# Patient Record
Sex: Male | Born: 1990 | Race: White | Hispanic: No | Marital: Single | Smoking: Former smoker
Health system: Southern US, Community
[De-identification: ages and names within clinical notes are randomized; demographics above are authoritative.]

## PROBLEM LIST (undated history)

## (undated) DIAGNOSIS — M25569 Pain in unspecified knee: Secondary | ICD-10-CM

## (undated) HISTORY — PX: FOOT CAPSULE RELEASE W/ PERCUTANEOUS HEEL CORD LENGTHENING, TIBIAL TENDON TRANSFER: SHX1658

## (undated) HISTORY — PX: ORIF FINGER / THUMB FRACTURE: SUR932

---
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
## 2016-03-31 ENCOUNTER — Emergency Department: Payer: BLUE CROSS/BLUE SHIELD

## 2016-03-31 ENCOUNTER — Emergency Department
Admission: EM | Admit: 2016-03-31 | Discharge: 2016-03-31 | Disposition: A | Discharge status: Home / Self Care | Payer: BLUE CROSS/BLUE SHIELD | Attending: Emergency Medicine | Admitting: Emergency Medicine

## 2016-03-31 ENCOUNTER — Encounter: Payer: Self-pay | Admitting: Emergency Medicine

## 2016-03-31 DIAGNOSIS — S51811A Laceration without foreign body of right forearm, initial encounter: Secondary | ICD-10-CM | POA: Insufficient documentation

## 2016-03-31 DIAGNOSIS — Y999 Unspecified external cause status: Secondary | ICD-10-CM | POA: Insufficient documentation

## 2016-03-31 DIAGNOSIS — Z87891 Personal history of nicotine dependence: Secondary | ICD-10-CM | POA: Insufficient documentation

## 2016-03-31 DIAGNOSIS — Z23 Encounter for immunization: Secondary | ICD-10-CM | POA: Insufficient documentation

## 2016-03-31 DIAGNOSIS — T07XXXA Unspecified multiple injuries, initial encounter: Secondary | ICD-10-CM

## 2016-03-31 DIAGNOSIS — Y92410 Unspecified street and highway as the place of occurrence of the external cause: Secondary | ICD-10-CM | POA: Diagnosis not present

## 2016-03-31 DIAGNOSIS — S50812A Abrasion of left forearm, initial encounter: Secondary | ICD-10-CM | POA: Diagnosis not present

## 2016-03-31 DIAGNOSIS — Y9389 Activity, other specified: Secondary | ICD-10-CM | POA: Insufficient documentation

## 2016-03-31 DIAGNOSIS — S59911A Unspecified injury of right forearm, initial encounter: Secondary | ICD-10-CM | POA: Diagnosis present

## 2016-03-31 MED ORDER — OXYCODONE-ACETAMINOPHEN 5-325 MG PO TABS
ORAL_TABLET | ORAL | Status: AC
  Filled 2016-03-31: qty 1

## 2016-03-31 MED ORDER — OXYCODONE-ACETAMINOPHEN 5-325 MG PO TABS
1.0000 | ORAL_TABLET | ORAL | Status: DC | PRN
Start: 2016-03-31 — End: 2016-03-31
  Administered 2016-03-31: 1 via ORAL

## 2016-03-31 MED ORDER — TETANUS-DIPHTH-ACELL PERTUSSIS 5-2.5-18.5 LF-MCG/0.5 IM SUSP
0.5000 mL | Freq: Once | INTRAMUSCULAR | Status: AC
  Administered 2016-03-31: 0.5 mL via INTRAMUSCULAR
  Filled 2016-03-31: qty 0.5

## 2016-03-31 MED ORDER — LIDOCAINE HCL (PF) 1 % IJ SOLN
INTRAMUSCULAR | Status: AC
  Administered 2016-03-31: 5 mL
  Filled 2016-03-31: qty 5
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
## 2016-03-31 NOTE — Discharge Instructions (Signed)
Laceration Care, Adult  A laceration is a cut that goes through all layers of the skin. The cut also goes into the tissue that is right under the skin. Some cuts heal on their own. Others need to be closed with stitches (sutures), staples, skin adhesive strips, or wound glue. Taking care of your cut lowers your risk of infection and helps your cut to heal better.  HOW TO TAKE CARE OF YOUR CUT  For stitches or staples:  · Keep the wound clean and dry.  · If you were given a bandage (dressing), you should change it at least one time per day or as told by your doctor. You should also change it if it gets wet or dirty.  · Keep the wound completely dry for the first 24 hours or as told by your doctor. After that time, you may take a shower or a bath. However, make sure that the wound is not soaked in water until after the stitches or staples have been removed.  · Clean the wound one time each day or as told by your doctor:    Wash the wound with soap and water.    Rinse the wound with water until all of the soap comes off.    Pat the wound dry with a clean towel. Do not rub the wound.  · After you clean the wound, put a thin layer of antibiotic ointment on it as told by your doctor. This ointment:    Helps to prevent infection.    Keeps the bandage from sticking to the wound.  · Have your stitches or staples removed as told by your doctor.  If your doctor used skin adhesive strips:   · Keep the wound clean and dry.  · If you were given a bandage, you should change it at least one time per day or as told by your doctor. You should also change it if it gets dirty or wet.  · Do not get the skin adhesive strips wet. You can take a shower or a bath, but be careful to keep the wound dry.  · If the wound gets wet, pat it dry with a clean towel. Do not rub the wound.  · Skin adhesive strips fall off on their own. You can trim the strips as the wound heals. Do not remove any strips that are still stuck to the wound. They will  fall off after a while.  If your doctor used wound glue:  · Try to keep your wound dry, but you may briefly wet it in the shower or bath. Do not soak the wound in water, such as by swimming.  · After you take a shower or a bath, gently pat the wound dry with a clean towel. Do not rub the wound.  · Do not do any activities that will make you really sweaty until the skin glue has fallen off on its own.  · Do not apply liquid, cream, or ointment medicine to your wound while the skin glue is still on.  · If you were given a bandage, you should change it at least one time per day or as told by your doctor. You should also change it if it gets dirty or wet.  · If a bandage is placed over the wound, do not let the tape for the bandage touch the skin glue.  · Do not pick at the glue. The skin glue usually stays on for 5-10 days. Then, it   or when wound glue stays in place and the wound is healed. Make sure to wear a sunscreen of at least 30 SPF. °· Take over-the-counter and prescription medicines only as told by your doctor. °· If you were given antibiotic medicine or ointment, take or apply it as told by your doctor. Do not stop using the antibiotic even if your wound is getting better. °· Do not scratch or pick at the wound. °· Keep all follow-up visits as told by your doctor. This is important. °· Check your wound every day for signs of infection. Watch for: °· Redness, swelling, or pain. °· Fluid, blood, or pus. °· Raise (elevate) the injured area above the level of your heart while you are sitting or lying down, if possible. °GET HELP IF: °· You got a tetanus shot and you have any of these problems at the injection site: °¨ Swelling. °¨ Very bad pain. °¨ Redness. °¨ Bleeding. °· You have a fever. °· A wound that was  closed breaks open. °· You notice a bad smell coming from your wound or your bandage. °· You notice something coming out of the wound, such as wood or glass. °· Medicine does not help your pain. °· You have more redness, swelling, or pain at the site of your wound. °· You have fluid, blood, or pus coming from your wound. °· You notice a change in the color of your skin near your wound. °· You need to change the bandage often because fluid, blood, or pus is coming from the wound. °· You start to have a new rash. °· You start to have numbness around the wound. °GET HELP RIGHT AWAY IF: °· You have very bad swelling around the wound. °· Your pain suddenly gets worse and is very bad. °· You notice painful lumps near the wound or on skin that is anywhere on your body. °· You have a red streak going away from your wound. °· The wound is on your hand or foot and you cannot move a finger or toe like you usually can. °· The wound is on your hand or foot and you notice that your fingers or toes look pale or bluish. °  °This information is not intended to replace advice given to you by your health care provider. Make sure you discuss any questions you have with your health care provider. °  °Document Released: 05/18/2008 Document Revised: 04/16/2015 Document Reviewed: 11/26/2014 °Elsevier Interactive Patient Education ©2016 Elsevier Inc. ° °Sutured Wound Care °Sutures are stitches that can be used to close wounds. Taking care of your wound properly can help to prevent pain and infection. It can also help your wound to heal more quickly. °HOW TO CARE FOR YOUR SUTURED WOUND °Wound Care °· Keep the wound clean and dry. °· If you were given a bandage (dressing), you should change it at least once per day or as directed by your health care provider. You should also change it if it becomes wet or dirty. °· Keep the wound completely dry for the first 24 hours or as directed by your health care provider. After that time, you may shower  or bathe. However, make sure that the wound is not soaked in water until the sutures have been removed. °· Clean the wound one time each day or as directed by your health care provider. °¨ Wash the wound with soap and water. °¨ Rinse the wound with water to remove all soap. °¨ Pat the wound dry with   a clean towel. Do not rub the wound.  Aftercleaning the wound, apply a thin layer of antibioticointment as directed by your health care provider. This will help to prevent infection and keep the dressing from sticking to the wound.  Have the sutures removed as directed by your health care provider. General Instructions  Take or apply medicines only as directed by your health care provider.  To help prevent scarring, make sure to cover your wound with sunscreen whenever you are outside after the sutures are removed and the wound is healed. Make sure to wear a sunscreen of at least 30 SPF.  If you were prescribed an antibiotic medicine or ointment, finish all of it even if you start to feel better.  Do not scratch or pick at the wound.  Keep all follow-up visits as directed by your health care provider. This is important.  Check your wound every day for signs of infection. Watch for:   Redness, swelling, or pain.  Fluid, blood, or pus.  Raise (elevate) the injured area above the level of your heart while you are sitting or lying down, if possible.  Avoid stretching your wound.  Drink enough fluids to keep your urine clear or pale yellow. SEEK MEDICAL CARE IF:  You received a tetanus shot and you have swelling, severe pain, redness, or bleeding at the injection site.  You have a fever.  A wound that was closed breaks open.  You notice a bad smell coming from the wound.  You notice something coming out of the wound, such as wood or glass.  Your pain is not controlled with medicine.  You have increased redness, swelling, or pain at the site of your wound.  You have fluid, blood,  or pus coming from your wound.  You notice a change in the color of your skin near your wound.  You need to change the dressing frequently due to fluid, blood, or pus draining from the wound.  You develop a new rash.  You develop numbness around the wound. SEEK IMMEDIATE MEDICAL CARE IF:  You develop severe swelling around the injury site.  Your pain suddenly increases and is severe.  You develop painful lumps near the wound or on skin that is anywhere on your body.  You have a red streak going away from your wound.  The wound is on your hand or foot and you cannot properly move a finger or toe.  The wound is on your hand or foot and you notice that your fingers or toes look pale or bluish.   This information is not intended to replace advice given to you by your health care provider. Make sure you discuss any questions you have with your health care provider.   Document Released: 01/07/2005 Document Revised: 04/16/2015 Document Reviewed: 07/12/2013 Elsevier Interactive Patient Education [employer]2016 Elsevier Inc.

## 2016-04-04 ENCOUNTER — Encounter: Payer: Self-pay | Admitting: Gynecology

## 2016-04-04 ENCOUNTER — Ambulatory Visit
Admission: EM | Admit: 2016-04-04 | Discharge: 2016-04-04 | Disposition: A | Discharge status: Home / Self Care | Payer: BLUE CROSS/BLUE SHIELD | Attending: Family Medicine | Admitting: Family Medicine

## 2016-04-04 DIAGNOSIS — T798XXA Other early complications of trauma, initial encounter: Secondary | ICD-10-CM | POA: Diagnosis not present

## 2016-04-04 HISTORY — DX: Pain in unspecified knee: M25.569

## 2016-04-04 MED ORDER — CEFUROXIME AXETIL 500 MG PO TABS: 500.0000 mg | ORAL_TABLET | Freq: Two times a day (BID) | ORAL | Status: DC

## 2016-04-04 MED ORDER — MUPIROCIN 2 % EX OINT: 1.0000 "application " | TOPICAL_OINTMENT | Freq: Three times a day (TID) | CUTANEOUS | Status: DC

## 2016-04-04 NOTE — ED Notes (Signed)
Per patient [facility] for [facility]. Per patient suture in right forearm and abrasion on left elbow. Patient question infection with left elbow abrasion.
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
## 2016-04-04 NOTE — Discharge Instructions (Signed)
Wound Infection °A wound infection happens when a type of germ (bacteria) grows in a wound. Caring for the infection can help the wound heal. Wound infections need treatment. °HOME CARE  °· Only take medicine as told by your doctor. °· Take your antibiotic medicine as told. Finish it even if you start to feel better. °· Clean the wound with mild soap and water as told. Rinse the soap off. Pat the area dry with a clean towel. Do not rub the wound. °· Change any bandages (dressings) as told by your doctor. °· Put cream and a bandage on the wound as told by your doctor. °· If the bandage sticks, wet it with soapy water to remove the bandage. °· Change the bandage if it gets wet, dirty, or starts to smell. °· Take showers. Do not take baths, swim, or do anything that puts your wound under water. °· Avoid exercise that makes you sweat. °· If your wound itches, use a medicine that helps stop itching. Do not pick or scratch at the wound. °· Keep all doctor visits as told. °GET HELP RIGHT AWAY IF:  °· You have more puffiness (swelling), pain, or redness around the wound. °· You have more yellowish-white fluid (pus) coming from the wound. °· You have a bad smell coming from the wound. °· Your wound breaks open more. °· You have a fever. °MAKE SURE YOU:  °· Understand these instructions. °· Will watch your condition. °· Will get help right away if you are not doing well or get worse. °  °This information is not intended to replace advice given to you by your health care provider. Make sure you discuss any questions you have with your health care provider. °  °Document Released: 09/08/2008 Document Revised: 02/22/2012 Document Reviewed: 05/20/2015 °Elsevier Interactive Patient Education ©2016 Elsevier Inc. ° °
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
## 2018-08-25 ENCOUNTER — Other Ambulatory Visit: Payer: Self-pay

## 2018-08-25 ENCOUNTER — Emergency Department
Admission: EM | Admit: 2018-08-25 | Discharge: 2018-08-25 | Disposition: A | Discharge status: Home / Self Care | Payer: BLUE CROSS/BLUE SHIELD | Attending: Student in an Organized Health Care Education/Training Program | Admitting: Student in an Organized Health Care Education/Training Program

## 2018-08-25 DIAGNOSIS — Z79899 Other long term (current) drug therapy: Secondary | ICD-10-CM | POA: Insufficient documentation

## 2018-08-25 DIAGNOSIS — Z87891 Personal history of nicotine dependence: Secondary | ICD-10-CM | POA: Insufficient documentation

## 2018-08-25 DIAGNOSIS — Y929 Unspecified place or not applicable: Secondary | ICD-10-CM | POA: Diagnosis not present

## 2018-08-25 DIAGNOSIS — S61210A Laceration without foreign body of right index finger without damage to nail, initial encounter: Secondary | ICD-10-CM

## 2018-08-25 DIAGNOSIS — S6991XA Unspecified injury of right wrist, hand and finger(s), initial encounter: Secondary | ICD-10-CM | POA: Diagnosis present

## 2018-08-25 DIAGNOSIS — S61211A Laceration without foreign body of left index finger without damage to nail, initial encounter: Secondary | ICD-10-CM | POA: Diagnosis not present

## 2018-08-25 DIAGNOSIS — Y999 Unspecified external cause status: Secondary | ICD-10-CM | POA: Insufficient documentation

## 2018-08-25 DIAGNOSIS — W268XXA Contact with other sharp object(s), not elsewhere classified, initial encounter: Secondary | ICD-10-CM | POA: Diagnosis not present

## 2018-08-25 DIAGNOSIS — Y939 Activity, unspecified: Secondary | ICD-10-CM | POA: Diagnosis not present

## 2018-08-25 MED ORDER — LIDOCAINE HCL (PF) 1 % IJ SOLN
5.0000 mL | Freq: Once | INTRAMUSCULAR | Status: AC
  Administered 2018-08-25: 5 mL via INTRADERMAL
  Filled 2018-08-25: qty 5

## 2018-08-25 MED ORDER — TRAMADOL HCL 50 MG PO TABS: 50.0000 mg | ORAL_TABLET | Freq: Four times a day (QID) | ORAL | 0 refills | Status: DC | PRN

## 2018-08-25 MED ORDER — CEPHALEXIN 500 MG PO CAPS: 500.0000 mg | ORAL_CAPSULE | Freq: Three times a day (TID) | ORAL | 0 refills | Status: DC

## 2018-08-25 NOTE — ED Provider Notes (Signed)
[facility] Emergency Department Provider Note  ____________________________________________   First MD Initiated Contact with Patient 08/25/18 1744     (approximate)  I have reviewed the triage vital signs and the nursing notes.   HISTORY  Chief Complaint Laceration    HPI Standley DakinsKellan R Kepner is a 27 y.o. male presents emergency department complaining of lacerations to both index fingers.  He cut it on metal prior to arrival.  He states his tetanus is up-to-date.  He denies any loss of motion of the fingers.    Past Medical History:  Diagnosis Date  . Knee pain     There are no active problems to display for this patient.   Past Surgical History:  Procedure Laterality Date  . FOOT CAPSULE RELEASE W/ PERCUTANEOUS HEEL CORD LENGTHENING, TIBIAL TENDON TRANSFER    . ORIF FINGER / THUMB FRACTURE      Prior to Admission medications   Medication Sig Start Date End Date Taking? Authorizing Provider  cefUROXime (CEFTIN) 500 MG tablet Take 1 tablet (500 mg total) by mouth 2 (two) times daily. 04/04/16   Hassan RowanWade, Eugene, MD  cephALEXin (KEFLEX) 500 MG capsule Take 1 capsule (500 mg total) by mouth 3 (three) times daily. 08/25/18   Dejanique Ruehl, Roselyn BeringSusan W, PA-C  mupirocin ointment (BACTROBAN) 2 % Apply 1 application topically 3 (three) times daily. 04/04/16   Hassan RowanWade, Eugene, MD  traMADol (ULTRAM) 50 MG tablet Take 1 tablet (50 mg total) by mouth every 6 (six) hours as needed. 08/25/18   Faythe GheeFisher, Mikiya Nebergall W, PA-C    Allergies Patient has no known allergies.  History reviewed. No pertinent family history.  Social History Social History   Tobacco Use  . Smoking status: Former Smoker  Substance Use Topics  . Alcohol use: Yes  . Drug use: Not on file    Review of Systems  Constitutional: No fever/chills Eyes: No visual changes. ENT: No sore throat. Respiratory: Denies cough Genitourinary: Negative for dysuria. Musculoskeletal: Negative for back pain. Skin: Negative  for rash.  Positive laceration to both index fingers    ____________________________________________   PHYSICAL EXAM:  VITAL SIGNS: ED Triage Vitals  Enc Vitals Group     BP 08/25/18 1710 127/82     Pulse Rate 08/25/18 1710 85     Resp 08/25/18 1710 16     Temp 08/25/18 1710 98.3 F (36.8 C)     Temp Source 08/25/18 1710 Oral     SpO2 08/25/18 1710 98 %     Weight 08/25/18 1711 210 lb (95.3 kg)     Height 08/25/18 1711 6\' 1"  (1.854 m)     Head Circumference --      Peak Flow --      Pain Score 08/25/18 1712 8     Pain Loc --      Pain Edu? --      Excl. in GC? --     Constitutional: Alert and oriented. Well appearing and in no acute distress. Eyes: Conjunctivae are normal.  Head: Atraumatic. Nose: No congestion/rhinnorhea. Mouth/Throat: Mucous membranes are moist.   Neck:  supple no lymphadenopathy noted Cardiovascular: Normal rate, regular rhythm.  Respiratory: Normal respiratory effort.  No retractions GU: deferred Musculoskeletal: FROM all extremities, warm and well perfused, 1 cm laceration to the right index finger, 1.5 cm laceration to the left index finger no foreign body is noted.  No tendon involvement. Neurologic:  Normal speech and language.  Skin:  Skin is warm, dry, positive lacerations  to both index fingers. No rash noted. Psychiatric: Mood and affect are normal. Speech and behavior are normal.  ____________________________________________   LABS (all labs ordered are listed, but only abnormal results are displayed)  Labs Reviewed - No data to display ____________________________________________   ____________________________________________  RADIOLOGY    ____________________________________________   PROCEDURES  Procedure(s) performed:   Marland KitchenMarland KitchenLaceration Repair Date/Time: 08/25/2018 7:51 PM Performed by: Faythe Ghee, PA-C Authorized by: Faythe Ghee, PA-C   Consent:    Consent obtained:  Verbal   Consent given by:  Patient    Risks discussed:  Infection, pain, retained foreign body and poor wound healing   Alternatives discussed:  Delayed treatment Anesthesia (see MAR for exact dosages):    Anesthesia method:  Nerve block   Block needle gauge:  25 G   Block anesthetic:  Lidocaine 1% w/o epi   Block injection procedure:  Anatomic landmarks identified, introduced needle, anatomic landmarks palpated, negative aspiration for blood and incremental injection   Block outcome:  Anesthesia achieved Laceration details:    Location:  Finger   Finger location:  L index finger   Length (cm):  1.5   Depth (mm):  2 Repair type:    Repair type:  Simple Pre-procedure details:    Preparation:  Patient was prepped and draped in usual sterile fashion Exploration:    Hemostasis achieved with:  Direct pressure   Wound exploration: wound explored through full range of motion     Wound extent: no foreign bodies/material noted, no tendon damage noted and no underlying fracture noted     Contaminated: no   Treatment:    Area cleansed with:  Betadine and saline   Amount of cleaning:  Standard   Irrigation solution:  Sterile saline   Irrigation method:  Syringe, pressure wash and tap Skin repair:    Repair method:  Sutures   Suture size:  5-0   Suture material:  Nylon   Suture technique:  Simple interrupted   Number of sutures:  6 Approximation:    Approximation:  Close Post-procedure details:    Dressing:  Non-adherent dressing   Patient tolerance of procedure:  Tolerated well, no immediate complications .Marland KitchenLaceration Repair Date/Time: 08/25/2018 7:53 PM Performed by: Faythe Ghee, PA-C Authorized by: Faythe Ghee, PA-C   Consent:    Consent obtained:  Verbal   Consent given by:  Patient   Risks discussed:  Infection, pain, poor cosmetic result, poor wound healing and retained foreign body   Alternatives discussed:  Delayed treatment Anesthesia (see MAR for exact dosages):    Anesthesia method:  Nerve block    Block anesthetic:  Lidocaine 1% w/o epi   Block injection procedure:  Anatomic landmarks identified, introduced needle, incremental injection, negative aspiration for blood and anatomic landmarks palpated   Block outcome:  Anesthesia achieved Laceration details:    Location:  Finger   Finger location:  R index finger   Length (cm):  1   Depth (mm):  2 Repair type:    Repair type:  Simple Pre-procedure details:    Preparation:  Patient was prepped and draped in usual sterile fashion Exploration:    Hemostasis achieved with:  Direct pressure   Wound exploration: wound explored through full range of motion     Wound extent: no foreign bodies/material noted, no tendon damage noted and no underlying fracture noted     Contaminated: no   Treatment:    Area cleansed with:  Betadine and saline  Amount of cleaning:  Standard   Irrigation solution:  Sterile saline   Irrigation method:  Pressure wash, syringe and tap Skin repair:    Repair method:  Sutures   Suture size:  5-0   Suture material:  Nylon   Suture technique:  Simple interrupted   Number of sutures:  5 Approximation:    Approximation:  Close Post-procedure details:    Dressing:  Non-adherent dressing   Patient tolerance of procedure:  Tolerated well, no immediate complications      ____________________________________________   INITIAL IMPRESSION / ASSESSMENT AND PLAN / ED COURSE  Pertinent labs & imaging results that were available during my care of the patient were reviewed by me and considered in my medical decision making (see chart for details).   Patient is a 27 year old male presents emergency department complaining of lacerations to both index fingers.  His tetanus is up-to-date.  On physical exam patient appears well.  The left index finger has a 1.5 cm laceration at the distal aspect.  No nail involvement.  The right index finger has a 1 cm laceration at the distal aspect.  No foreign bodies noted in either  laceration.  Lacerations were closed with 6 and 5 simple sutures.  See procedure note.  The patient was given instructions on how to care for the laceration.  He is clean the area with soap and water daily.  Take the Keflex to prevent infection.  Have sutures removed in 7 to 10 days.  Keep the hands as dry as possible when at work.  He states he understands will comply with our instructions.  Was discharged in stable condition.     As part of my medical decision making, I reviewed the following data within the electronic MEDICAL RECORD NUMBER Nursing notes reviewed and incorporated, Old chart reviewed, Notes from prior ED visits and [place] Controlled Substance Database  ____________________________________________   FINAL CLINICAL IMPRESSION(S) / ED DIAGNOSES  Final diagnoses:  Laceration of right index finger without foreign body without damage to nail, initial encounter  Laceration of left index finger without foreign body without damage to nail, initial encounter      NEW MEDICATIONS STARTED DURING THIS VISIT:  New Prescriptions   CEPHALEXIN (KEFLEX) 500 MG CAPSULE    Take 1 capsule (500 mg total) by mouth 3 (three) times daily.   TRAMADOL (ULTRAM) 50 MG TABLET    Take 1 tablet (50 mg total) by mouth every 6 (six) hours as needed.     Note:  This document was prepared using Dragon voice recognition software and may include unintentional dictation errors.    Faythe Ghee, PA-C 08/25/18 1956    Willy Eddy, MD 08/25/18 2017

## 2018-08-25 NOTE — ED Triage Notes (Signed)
bilat lacs to ends of pointer fingers. Wrapped in gauze in triage. Cut on metal. Tetanus UTD. Alert, oriented, ambulatory.

## 2018-08-25 NOTE — ED Notes (Signed)
See triage note  States he cut both index fingers on metal

## 2018-08-25 NOTE — Discharge Instructions (Signed)
Wash the area with soap and water daily.  Follow-up with your regular doctor in acute care in 7 to 10 days for suture removal.  Keep your hands as clean and dry as possible.  Do not soak your hands down in water or wear plastic gloves all day as this will prevent healing.  Return to the emergency department if any problems

## 2023-10-06 ENCOUNTER — Other Ambulatory Visit: Payer: Self-pay

## 2023-10-06 MED ORDER — LISDEXAMFETAMINE DIMESYLATE 40 MG PO CAPS: 40.0000 mg | ORAL_CAPSULE | Freq: Every morning | ORAL | 0 refills | Status: DC

## 2024-01-18 ENCOUNTER — Other Ambulatory Visit: Payer: Self-pay

## 2024-01-18 MED ORDER — LISDEXAMFETAMINE DIMESYLATE 40 MG PO CAPS
40.0000 mg | ORAL_CAPSULE | Freq: Every morning | ORAL | 0 refills | Status: DC
  Filled 2024-01-18: qty 14, 14d supply, fill #0

## 2024-01-25 ENCOUNTER — Other Ambulatory Visit: Payer: Self-pay

## 2024-02-01 ENCOUNTER — Other Ambulatory Visit: Payer: Self-pay

## 2024-02-01 MED ORDER — LISDEXAMFETAMINE DIMESYLATE 40 MG PO CAPS
40.0000 mg | ORAL_CAPSULE | Freq: Every morning | ORAL | 0 refills | Status: DC
  Filled 2024-02-10: qty 30, 30d supply, fill #0

## 2024-02-10 ENCOUNTER — Other Ambulatory Visit: Payer: Self-pay

## 2024-02-17 ENCOUNTER — Other Ambulatory Visit: Payer: Self-pay

## 2024-02-17 MED ORDER — HYDROXYZINE HCL 10 MG PO TABS
10.0000 mg | ORAL_TABLET | Freq: Two times a day (BID) | ORAL | 0 refills | Status: DC
  Filled 2024-02-17: qty 180, 90d supply, fill #0

## 2024-02-17 MED ORDER — VENLAFAXINE HCL ER 37.5 MG PO CP24
37.5000 mg | ORAL_CAPSULE | Freq: Every day | ORAL | 0 refills | Status: AC
  Filled 2024-02-17: qty 90, 90d supply, fill #0

## 2024-02-17 MED ORDER — LISDEXAMFETAMINE DIMESYLATE 50 MG PO CAPS
50.0000 mg | ORAL_CAPSULE | Freq: Every day | ORAL | 0 refills | Status: DC
  Filled 2024-02-17: qty 30, 30d supply, fill #0
  Filled 2024-03-15: qty 10, 10d supply, fill #0
  Filled 2024-03-15: qty 20, 20d supply, fill #0

## 2024-02-25 ENCOUNTER — Other Ambulatory Visit: Payer: Self-pay

## 2024-03-01 ENCOUNTER — Other Ambulatory Visit: Payer: Self-pay

## 2024-03-07 ENCOUNTER — Other Ambulatory Visit: Payer: Self-pay

## 2024-03-15 ENCOUNTER — Other Ambulatory Visit: Payer: Self-pay

## 2024-03-15 MED ORDER — IPRATROPIUM BROMIDE 0.06 % NA SOLN
2.0000 | Freq: Three times a day (TID) | NASAL | 11 refills | Status: AC
  Filled 2024-03-15: qty 15, 25d supply, fill #0

## 2024-03-15 MED ORDER — BENZONATATE 100 MG PO CAPS
100.0000 mg | ORAL_CAPSULE | Freq: Three times a day (TID) | ORAL | 0 refills | Status: AC
  Filled 2024-03-15: qty 30, 5d supply, fill #0

## 2024-03-24 ENCOUNTER — Other Ambulatory Visit: Payer: Self-pay

## 2024-03-24 MED ORDER — SERTRALINE HCL 50 MG PO TABS
50.0000 mg | ORAL_TABLET | Freq: Every day | ORAL | 0 refills | Status: AC
  Filled 2024-03-24 – 2024-04-18 (×2): qty 30, 30d supply, fill #0

## 2024-03-24 MED ORDER — LISDEXAMFETAMINE DIMESYLATE 50 MG PO CAPS
50.0000 mg | ORAL_CAPSULE | Freq: Every day | ORAL | 0 refills | Status: DC
  Filled 2024-04-12: qty 30, 30d supply, fill #0

## 2024-04-04 ENCOUNTER — Other Ambulatory Visit: Payer: Self-pay

## 2024-04-10 ENCOUNTER — Other Ambulatory Visit: Payer: Self-pay

## 2024-04-10 MED ORDER — LISDEXAMFETAMINE DIMESYLATE 50 MG PO CAPS: 50.0000 mg | ORAL_CAPSULE | Freq: Every day | ORAL | 0 refills | Status: DC

## 2024-04-12 ENCOUNTER — Other Ambulatory Visit: Payer: Self-pay

## 2024-04-18 ENCOUNTER — Other Ambulatory Visit: Payer: Self-pay

## 2024-04-25 ENCOUNTER — Other Ambulatory Visit: Payer: Self-pay

## 2024-04-25 MED ORDER — CITALOPRAM HYDROBROMIDE 20 MG PO TABS
20.0000 mg | ORAL_TABLET | Freq: Every day | ORAL | 0 refills | Status: DC
  Filled 2024-04-25 – 2024-04-26 (×2): qty 30, 30d supply, fill #0

## 2024-04-26 ENCOUNTER — Other Ambulatory Visit: Payer: Self-pay

## 2024-05-04 ENCOUNTER — Other Ambulatory Visit: Payer: Self-pay

## 2024-05-04 MED ORDER — CITALOPRAM HYDROBROMIDE 20 MG PO TABS
20.0000 mg | ORAL_TABLET | Freq: Every day | ORAL | 0 refills | Status: DC
  Filled 2024-06-02: qty 90, 90d supply, fill #0

## 2024-05-04 MED ORDER — LISDEXAMFETAMINE DIMESYLATE 50 MG PO CAPS
50.0000 mg | ORAL_CAPSULE | Freq: Every day | ORAL | 0 refills | Status: DC
  Filled 2024-06-02: qty 90, 90d supply, fill #0

## 2024-05-04 MED ORDER — HYDROXYZINE HCL 10 MG PO TABS
10.0000 mg | ORAL_TABLET | Freq: Two times a day (BID) | ORAL | 0 refills | Status: AC
  Filled 2024-05-04: qty 180, 90d supply, fill #0

## 2024-05-15 ENCOUNTER — Other Ambulatory Visit: Payer: Self-pay

## 2024-05-18 ENCOUNTER — Other Ambulatory Visit: Payer: Self-pay

## 2024-05-18 MED ORDER — AMOXICILLIN-POT CLAVULANATE 875-125 MG PO TABS
1.0000 | ORAL_TABLET | Freq: Two times a day (BID) | ORAL | 0 refills | Status: DC
  Filled 2024-05-18: qty 20, 10d supply, fill #0

## 2024-05-19 ENCOUNTER — Observation Stay
Admission: EM | Admit: 2024-05-19 | Discharge: 2024-05-20 | Disposition: A | Discharge status: Home / Self Care | Attending: Internal Medicine | Admitting: Internal Medicine

## 2024-05-19 ENCOUNTER — Other Ambulatory Visit: Payer: Self-pay

## 2024-05-19 ENCOUNTER — Emergency Department

## 2024-05-19 DIAGNOSIS — A419 Sepsis, unspecified organism: Secondary | ICD-10-CM | POA: Diagnosis not present

## 2024-05-19 DIAGNOSIS — J36 Peritonsillar abscess: Secondary | ICD-10-CM | POA: Diagnosis present

## 2024-05-19 DIAGNOSIS — J029 Acute pharyngitis, unspecified: Principal | ICD-10-CM

## 2024-05-19 DIAGNOSIS — Z79899 Other long term (current) drug therapy: Secondary | ICD-10-CM | POA: Diagnosis not present

## 2024-05-19 DIAGNOSIS — Z87891 Personal history of nicotine dependence: Secondary | ICD-10-CM | POA: Insufficient documentation

## 2024-05-19 DIAGNOSIS — J039 Acute tonsillitis, unspecified: Secondary | ICD-10-CM | POA: Diagnosis present

## 2024-05-19 LAB — CBC WITH DIFFERENTIAL/PLATELET
Abs Immature Granulocytes: 0.09 10*3/uL — ABNORMAL HIGH (ref 0.00–0.07)
Basophils Absolute: 0.1 10*3/uL (ref 0.0–0.1)
Basophils Relative: 0 %
Eosinophils Absolute: 0.4 10*3/uL (ref 0.0–0.5)
Eosinophils Relative: 2 %
HCT: 38.2 % — ABNORMAL LOW (ref 39.0–52.0)
Hemoglobin: 12.7 g/dL — ABNORMAL LOW (ref 13.0–17.0)
Immature Granulocytes: 0 %
Lymphocytes Relative: 12 %
Lymphs Abs: 2.4 10*3/uL (ref 0.7–4.0)
MCH: 28.9 pg (ref 26.0–34.0)
MCHC: 33.2 g/dL (ref 30.0–36.0)
MCV: 87 fL (ref 80.0–100.0)
Monocytes Absolute: 1.9 10*3/uL — ABNORMAL HIGH (ref 0.1–1.0)
Monocytes Relative: 9 %
Neutro Abs: 15.7 10*3/uL — ABNORMAL HIGH (ref 1.7–7.7)
Neutrophils Relative %: 77 %
Platelets: 330 10*3/uL (ref 150–400)
RBC: 4.39 MIL/uL (ref 4.22–5.81)
RDW: 13.1 % (ref 11.5–15.5)
WBC: 20.6 10*3/uL — ABNORMAL HIGH (ref 4.0–10.5)
nRBC: 0 % (ref 0.0–0.2)

## 2024-05-19 LAB — BASIC METABOLIC PANEL WITH GFR
Anion gap: 9 (ref 5–15)
BUN: 14 mg/dL (ref 6–20)
CO2: 26 mmol/L (ref 22–32)
Calcium: 8.6 mg/dL — ABNORMAL LOW (ref 8.9–10.3)
Chloride: 101 mmol/L (ref 98–111)
Creatinine, Ser: 0.98 mg/dL (ref 0.61–1.24)
GFR, Estimated: >60 mL/min (ref >60)
Glucose, Bld: 135 mg/dL — ABNORMAL HIGH (ref 70–99)
Potassium: 3.5 mmol/L (ref 3.5–5.1)
Sodium: 136 mmol/L (ref 135–145)

## 2024-05-19 MED ORDER — ACETAMINOPHEN 325 MG PO TABS: 650.0000 mg | ORAL_TABLET | Freq: Four times a day (QID) | ORAL | Status: DC | PRN

## 2024-05-19 MED ORDER — HYDROCODONE-ACETAMINOPHEN 7.5-325 MG/15ML PO SOLN
10.0000 mL | Freq: Once | ORAL | Status: AC
  Administered 2024-05-19: 10 mL via ORAL
  Filled 2024-05-19: qty 15

## 2024-05-19 MED ORDER — SODIUM CHLORIDE 0.9 % IV SOLN: 1.5000 g | Freq: Four times a day (QID) | INTRAVENOUS | Status: DC

## 2024-05-19 MED ORDER — FLUTICASONE PROPIONATE 50 MCG/ACT NA SUSP
1.0000 | Freq: Every day | NASAL | Status: DC
  Administered 2024-05-19 – 2024-05-20 (×2): 1 via NASAL
  Filled 2024-05-19: qty 16

## 2024-05-19 MED ORDER — HYDROCODONE-ACETAMINOPHEN 7.5-325 MG/15ML PO SOLN: 10.0000 mL | Freq: Four times a day (QID) | ORAL | Status: DC | PRN

## 2024-05-19 MED ORDER — ALUM & MAG HYDROXIDE-SIMETH 200-200-20 MG/5ML PO SUSP
30.0000 mL | ORAL | Status: DC | PRN
  Administered 2024-05-20: 30 mL via ORAL
  Filled 2024-05-19 (×2): qty 30

## 2024-05-19 MED ORDER — ONDANSETRON HCL 4 MG PO TABS: 4.0000 mg | ORAL_TABLET | Freq: Four times a day (QID) | ORAL | Status: DC | PRN

## 2024-05-19 MED ORDER — DEXAMETHASONE SODIUM PHOSPHATE 10 MG/ML IJ SOLN
8.0000 mg | Freq: Three times a day (TID) | INTRAMUSCULAR | Status: DC
  Administered 2024-05-19 – 2024-05-20 (×3): 8 mg via INTRAVENOUS
  Filled 2024-05-19 (×3): qty 1

## 2024-05-19 MED ORDER — KETOROLAC TROMETHAMINE 30 MG/ML IJ SOLN: 30.0000 mg | Freq: Four times a day (QID) | INTRAMUSCULAR | Status: DC | PRN

## 2024-05-19 MED ORDER — KETOROLAC TROMETHAMINE 15 MG/ML IJ SOLN
15.0000 mg | Freq: Four times a day (QID) | INTRAMUSCULAR | Status: DC | PRN
Start: 2024-05-19 — End: 2024-05-20
  Administered 2024-05-19: 15 mg via INTRAVENOUS
  Filled 2024-05-19: qty 1

## 2024-05-19 MED ORDER — DEXAMETHASONE SODIUM PHOSPHATE 10 MG/ML IJ SOLN
10.0000 mg | Freq: Once | INTRAMUSCULAR | Status: AC
  Administered 2024-05-19: 10 mg via INTRAVENOUS
  Filled 2024-05-19: qty 1

## 2024-05-19 MED ORDER — SODIUM CHLORIDE 0.9 % IV SOLN
3.0000 g | Freq: Once | INTRAVENOUS | Status: AC
  Administered 2024-05-19: 3 g via INTRAVENOUS
  Filled 2024-05-19: qty 8

## 2024-05-19 MED ORDER — MORPHINE SULFATE (PF) 2 MG/ML IV SOLN: 2.0000 mg | INTRAVENOUS | Status: DC | PRN

## 2024-05-19 MED ORDER — ACETAMINOPHEN 650 MG RE SUPP: 650.0000 mg | Freq: Four times a day (QID) | RECTAL | Status: DC | PRN

## 2024-05-19 MED ORDER — ONDANSETRON HCL 4 MG/2ML IJ SOLN: 4.0000 mg | Freq: Four times a day (QID) | INTRAMUSCULAR | Status: DC | PRN

## 2024-05-19 MED ORDER — HYDROCODONE-ACETAMINOPHEN 5-325 MG PO TABS
1.0000 | ORAL_TABLET | ORAL | Status: DC | PRN
  Administered 2024-05-19 (×3): 2 via ORAL
  Administered 2024-05-20: 1 via ORAL
  Administered 2024-05-20: 2 via ORAL
  Filled 2024-05-19 (×5): qty 2
  Filled 2024-05-19: qty 1

## 2024-05-19 MED ORDER — SODIUM CHLORIDE 0.9 % IV BOLUS
1000.0000 mL | Freq: Once | INTRAVENOUS | Status: AC
Start: 2024-05-19 — End: 2024-05-19
  Administered 2024-05-19: 1000 mL via INTRAVENOUS

## 2024-05-19 MED ORDER — CITALOPRAM HYDROBROMIDE 20 MG PO TABS
20.0000 mg | ORAL_TABLET | Freq: Every day | ORAL | Status: DC
  Administered 2024-05-19 – 2024-05-20 (×2): 20 mg via ORAL
  Filled 2024-05-19 (×2): qty 1

## 2024-05-19 MED ORDER — IOHEXOL 300 MG/ML  SOLN
75.0000 mL | Freq: Once | INTRAMUSCULAR | Status: AC | PRN
  Administered 2024-05-19: 75 mL via INTRAVENOUS

## 2024-05-19 MED ORDER — SODIUM CHLORIDE 0.9 % IV SOLN
3.0000 g | Freq: Four times a day (QID) | INTRAVENOUS | Status: DC
  Administered 2024-05-19 – 2024-05-20 (×4): 3 g via INTRAVENOUS
  Filled 2024-05-19 (×5): qty 8

## 2024-05-19 MED ORDER — SODIUM CHLORIDE 0.9 % IV SOLN: INTRAVENOUS | Status: AC

## 2024-05-19 MED ORDER — POLYETHYLENE GLYCOL 3350 17 G PO PACK: 17.0000 g | PACK | Freq: Every day | ORAL | Status: DC | PRN

## 2024-05-19 NOTE — TOC CM/SW Note (Signed)
 Transition of Care [facility]) - Inpatient Brief Assessment   Patient Details  Name: Danny Cunningham MRN: [id number] Date of Birth: 21-Mar-1991  [facility] CM/SW Contact:    Odilia Bennett, LCSW Phone Number: 05/19/2024, 3:18 PM   Clinical Narrative: CSW reviewed chart. No TOC needs identified so far. CSW will continue to follow progress. Please place [facility] consult if any needs arise.  Transition of Care Asessment: Insurance and Status: Insurance coverage has been reviewed Patient has primary care physician: Yes Home environment has been reviewed: Single family home Prior level of function:: Not documented Prior/Current Home Services: No current home services Social Drivers of Health Review: SDOH reviewed no interventions necessary Readmission risk has been reviewed: Yes Transition of care needs: no transition of care needs at this time

## 2024-05-19 NOTE — Assessment & Plan Note (Signed)
 The patient developed throat pain on 5/331/2025. He has fever, chills, nausea, and vomiting on 05/14/2024. He then felt better until the following Thursday when he again had fever, chills, and worsening throat pain. He awoke late that night feeling that he couldn't breath due to the size of his tonsils and noticing that his neck was swollen.

## 2024-05-19 NOTE — Progress Notes (Signed)
 Patient re-evaluated this evening.  Significant improvement continues per patient.  Pain controlled and tolerating PO.  Impression:  Tonsillitis with Bilateral tonsillar abscesses  Plan:  Anticipate resolution with medications and advance diet as tolerated.  Will see in a.m.  If continues to improve at current rate, anticipate discharge tomorrow on Augmentin and Medrol dose pack.

## 2024-05-19 NOTE — Assessment & Plan Note (Signed)
 Upon admission patient met sepsis criteria with leukocytosis, tachycardia, hypotension, with a known source of infection (tonsillitis). It is now resolved. Blood cultures x 2 obtained.

## 2024-05-19 NOTE — ED Triage Notes (Signed)
 Patient C/O sore throat and feeling like his throat is closing. Patient was seen at urgent care today and diagnosed with strep. His provider told him to go to the ER if he felt like things were getting worse. Patient says it feels like his throat is closing and is making it difficult to breathe.

## 2024-05-19 NOTE — ED Provider Notes (Signed)
 [facility] Provider Note    Event Date/Time   First MD Initiated Contact with Patient 05/19/24 [phone]     (approximate)   History   Sore Throat   HPI  Danny Cunningham is a 33 y.o. male who presents to the ED from home with complaint of sore throat and feels like his throat is closing.  Patient reports sore throat x 6 days.  Seen by his PCP and prescribed amoxicillin.  States throat felt better on Tuesday and Wednesday, however felt much worse yesterday.  Fever over the weekend but none since Monday. Denies associated chills, chest pain, shortness of breath, abdominal pain, nausea, vomiting or dizziness.   Past Medical History   Past Medical History:  Diagnosis Date   Knee pain      Active Problem List  There are no active problems to display for this patient.    Past Surgical History   Past Surgical History:  Procedure Laterality Date   FOOT CAPSULE RELEASE W/ PERCUTANEOUS HEEL CORD LENGTHENING, TIBIAL TENDON TRANSFER     ORIF FINGER / THUMB FRACTURE       Home Medications   Prior to Admission medications   Medication Sig Start Date End Date Taking? Authorizing Provider  amoxicillin-clavulanate (AUGMENTIN) 875-125 MG tablet Take 1 tablet (875 mg total) by mouth every 12 (twelve) hours for 10 days 05/18/24     benzonatate  (TESSALON ) 100 MG capsule Take 1-2 capsules (100-200 mg total) by mouth 3 (three) times daily for cough 03/15/24     cefUROXime  (CEFTIN ) 500 MG tablet Take 1 tablet (500 mg total) by mouth 2 (two) times daily. 04/04/16   Sharren Decree, MD  cephALEXin  (KEFLEX ) 500 MG capsule Take 1 capsule (500 mg total) by mouth 3 (three) times daily. 08/25/18   Fisher, Rufino Coulter, PA-C  citalopram  (CELEXA ) 20 MG tablet Take 1 tablet (20 mg total) by mouth daily. 05/04/24     hydrOXYzine  (ATARAX ) 10 MG tablet Take 1 tablet (10 mg total) by mouth 2 (two) times daily. 02/17/24     hydrOXYzine  (ATARAX ) 10 MG tablet Take 1 tablet (10 mg total) by mouth 2  (two) times daily. 05/04/24     ipratropium (ATROVENT ) 0.06 % nasal spray Place 2 sprays into each nostril 3 (three) times daily. 03/15/24     lisdexamfetamine (VYVANSE ) 40 MG capsule Take 1 capsule (40 mg total) by mouth every morning. 10/06/23     lisdexamfetamine (VYVANSE ) 40 MG capsule Take 1 capsule (40 mg total) by mouth every morning. 01/18/24     lisdexamfetamine (VYVANSE ) 40 MG capsule Take 1 capsule (40 mg total) by mouth in the morning. 02/01/24     lisdexamfetamine (VYVANSE ) 50 MG capsule Take 1 capsule (50 mg total) by mouth daily. 03/24/24     lisdexamfetamine (VYVANSE ) 50 MG capsule Take 1 capsule (50 mg total) by mouth daily. 04/10/24     lisdexamfetamine (VYVANSE ) 50 MG capsule Take 1 capsule (50 mg total) by mouth daily. 05/04/24     mupirocin  ointment (BACTROBAN ) 2 % Apply 1 application topically 3 (three) times daily. 04/04/16   Sharren Decree, MD  sertraline  (ZOLOFT ) 50 MG tablet Take 1 tablet (50 mg total) by mouth daily with breakfast. 03/24/24     traMADol  (ULTRAM ) 50 MG tablet Take 1 tablet (50 mg total) by mouth every 6 (six) hours as needed. 08/25/18   Fisher, Rufino Coulter, PA-C  venlafaxine  XR (EFFEXOR  XR) 37.5 MG 24 hr capsule Take 1 capsule (37.5 mg  total) by mouth daily. 02/17/24        Allergies  Patient has no known allergies.   Family History  History reviewed. No pertinent family history.   Physical Exam  Triage Vital Signs: ED Triage Vitals [05/19/24 0136]  Encounter Vitals Group     BP 91/69     Systolic BP Percentile      Diastolic BP Percentile      Pulse Rate 99     Resp 20     Temp 98.5 F (36.9 C)     Temp Source Oral     SpO2 100 %     Weight 170 lb (77.1 kg)     Height 6\' 1"  (1.854 m)     Head Circumference      Peak Flow      Pain Score 9     Pain Loc      Pain Education      Exclude from Growth Chart     Updated Vital Signs: BP 98/74   Pulse 81   Temp 98.5 F (36.9 C) (Oral)   Resp 20   Ht 6\' 1"  (1.854 m)   Wt 77.1 kg   SpO2 98%   BMI  22.43 kg/m    General: Awake, moderate distress.  CV:  RRR. Good peripheral perfusion.  Resp:  Normal effort. CTAB. Abd:  No distention.  Other:  Posterior oropharynx erythematous with swollen tonsils, fullness right>left. Muffled voice. Tolerating secretions. +cervical LAD.   ED Results / Procedures / Treatments  Labs (all labs ordered are listed, but only abnormal results are displayed) Labs Reviewed  CBC WITH DIFFERENTIAL/PLATELET - Abnormal; Notable for the following components:      Result Value   WBC 20.6 (*)    Hemoglobin 12.7 (*)    HCT 38.2 (*)    Neutro Abs 15.7 (*)    Monocytes Absolute 1.9 (*)    Abs Immature Granulocytes 0.09 (*)    All other components within normal limits  BASIC METABOLIC PANEL WITH GFR - Abnormal; Notable for the following components:   Glucose, Bld 135 (*)    Calcium 8.6 (*)    All other components within normal limits     EKG  None   RADIOLOGY Dependently visualized interpreted patient's imaging studies as well as noted the radiology interpretation:  CT Soft tissue neck: bilateral tonsillar abscesses  Official radiology report(s): CT Soft Tissue Neck W Contrast Result Date: 05/19/2024 CLINICAL DATA:  Epiglottitis or tonsillitis suspected eval PTA EXAM: CT NECK WITH CONTRAST TECHNIQUE: Multidetector CT imaging of the neck was performed using the standard protocol following the bolus administration of intravenous contrast. RADIATION DOSE REDUCTION: This exam was performed according to the departmental dose-optimization program which includes automated exposure control, adjustment of the mA and/or kV according to patient size and/or use of iterative reconstruction technique. CONTRAST:  75mL OMNIPAQUE IOHEXOL 300 MG/ML  SOLN COMPARISON:  None Available. FINDINGS: Pharynx and larynx: Edematous enlarged tonsils bilaterally, compatible tonsillitis. Approximately 1.3 x 1.7 cm peripherally enhancing fluid collection within the right tonsil and  complex approximately 1.2 x 1 cm peripherally enhancing fluid collection in the left tonsil, compatible with abscesses. Surrounding edema including small volume of prevertebral edema. Normal epiglottis and larynx. Salivary glands: No inflammation, mass, or stone. Thyroid: Normal. Lymph nodes: Enlarged upper cervical chain lymph nodes bilaterally. Vascular: No obvious abnormality on limited assessment due to non arterial timing. Limited intracranial: Negative. Visualized orbits: Not imaged. Mastoids and visualized paranasal sinuses: Clear. Skeleton:  No acute abnormality. Upper chest: Negative. IMPRESSION: 1. Findings compatible with tonsillitis and bilateral tonsillar abscesses, described above. Surrounding edema including small volume of prevertebral edema. 2. Enlarged upper cervical chain lymph nodes bilaterally, likely reactive. Electronically Signed   By: Stevenson Elbe M.D.   On: 05/19/2024 02:56     PROCEDURES:  Critical Care performed: Yes, see critical care procedure note(s)  CRITICAL CARE Performed by: Norlene Beavers   Total critical care time: 30 minutes  Critical care time was exclusive of separately billable procedures and treating other patients.  Critical care was necessary to treat or prevent imminent or life-threatening deterioration.  Critical care was time spent personally by me on the following activities: development of treatment plan with patient and/or surrogate as well as nursing, discussions with consultants, evaluation of patient's response to treatment, examination of patient, obtaining history from patient or surrogate, ordering and performing treatments and interventions, ordering and review of laboratory studies, ordering and review of radiographic studies, pulse oximetry and re-evaluation of patient's condition.   Aaron Aas1-3 Lead EKG Interpretation  Performed by: Norlene Beavers, MD Authorized by: Norlene Beavers, MD     Interpretation: normal     ECG rate:  95   ECG rate  assessment: normal     Rhythm: sinus rhythm     Ectopy: none     Conduction: normal   Comments:     Patient placed on cardiac monitor to evaluate for arrhythmias    MEDICATIONS ORDERED IN ED: Medications  sodium chloride 0.9 % bolus 1,000 mL (1,000 mLs Intravenous New Bag/Given 05/19/24 0213)  dexamethasone (DECADRON) injection 10 mg (10 mg Intravenous Given 05/19/24 0210)  Ampicillin-Sulbactam (UNASYN) 3 g in sodium chloride 0.9 % 100 mL IVPB (3 g Intravenous New Bag/Given 05/19/24 0214)  HYDROcodone-acetaminophen  (HYCET) 7.5-325 mg/15 ml solution 10 mL (10 mLs Oral Given 05/19/24 0216)  iohexol (OMNIPAQUE) 300 MG/ML solution 75 mL (75 mLs Intravenous Contrast Given 05/19/24 0224)     IMPRESSION / MDM / ASSESSMENT AND PLAN / ED COURSE  I reviewed the triage vital signs and the nursing notes.                             33 year old male on Amoxicillin for sore throat presenting with worsening symptoms. Differential diagnosis includes but is not limited to peritonsillar abscess, epiglottitis, retropharyngeal abscess, etc. I have personally reviewed patient's records and note PCP office visit from yesterday.  Patient's presentation is most consistent with acute complicated illness / injury requiring diagnostic workup.  The patient is on the cardiac monitor to evaluate for evidence of arrhythmia and/or significant heart rate changes.  Will obtain basic labs, CT soft tissue neck. Initiate IV fluid hydration, Decadron, Unasyn, Hycet for throat pain. Will reassess.  Clinical Course as of 05/19/24 0314  Fri May 19, 2024  0309 CT demonstrates bilateral tonsillar abscesses. Discussed case with ENT on call Dr. Donnie Galea who will evaluate patient in the hospital.  Will consult hospitalist services for evaluation and admission. [JS]    Clinical Course User Index [JS] Norlene Beavers, MD     FINAL CLINICAL IMPRESSION(S) / ED DIAGNOSES   Final diagnoses:  Sore throat  Tonsillitis  Tonsillar abscess      Rx / DC Orders   ED Discharge Orders     None        Note:  This document was prepared using Dragon voice recognition software and may include unintentional  dictation errors.   Nimisha Rathel J, MD 05/19/24 [phone]

## 2024-05-19 NOTE — Plan of Care (Signed)
  Problem: Clinical Measurements: Goal: Ability to maintain clinical measurements within normal limits will improve Outcome: Progressing Goal: Will remain free from infection Outcome: Progressing Goal: Diagnostic test results will improve Outcome: Progressing Goal: Respiratory complications will improve Outcome: Progressing Goal: Cardiovascular complication will be avoided Outcome: Progressing   Problem: Activity: Goal: Risk for activity intolerance will decrease Outcome: Progressing   Problem: Nutrition: Goal: Adequate nutrition will be maintained Outcome: Progressing   Problem: Coping: Goal: Level of anxiety will decrease Outcome: Progressing   Problem: Elimination: Goal: Will not experience complications related to bowel motility Outcome: Progressing Goal: Will not experience complications related to urinary retention Outcome: Progressing   Problem: Pain Managment: Goal: General experience of comfort will improve and/or be controlled Outcome: Progressing   Problem: Safety: Goal: Ability to remain free from injury will improve Outcome: Progressing

## 2024-05-19 NOTE — Consult Note (Signed)
 ..Danny Cunningham, Danny Cunningham [id number] 1991-06-07 Swayze, [name], DO  Reason for Consult: Tonsillitis, tonsillar abscesses  HPI: 33 year old male presented to ER with 5 day history of a sore throat.  He reports it initially began over the weekend then got better but worsened again Thursday.  He presented to [facility] where he was given Augmentin.  He got one dose in and then woke up early Friday morning with worsening swelling and breathing when sleeping.  PResented to ER and given Unasyn and Decadron and CT scan that showed tonsillitis with small bilateral tonsillar abscesses.  Patient got decadron and Unasyn around 0215 and reports feeling significantly better now.  Reports odynophagia, phlegm, drainage.  Reports snoring while sleeping.  Tolerating secretions.  Reports he is very hungry.  Allergies: No Known Allergies  ROS: Review of systems normal other than 12 systems except per HPI.  PMH:  Past Medical History:  Diagnosis Date   Knee pain     FH: History reviewed. No pertinent family history.  SH:  Social History   Socioeconomic History   Marital status: Single    Spouse name: Not on file   Number of children: Not on file   Years of education: Not on file   Highest education level: Not on file  Occupational History   Not on file  Tobacco Use   Smoking status: Former   Smokeless tobacco: Not on file  Substance and Sexual Activity   Alcohol use: Yes   Drug use: Not on file   Sexual activity: Not on file  Other Topics Concern   Not on file  Social History Narrative   Not on file   Social Drivers of Health   Financial Resource Strain: Patient Declined (01/10/2024)   Received from [facility]   Overall Financial Resource Strain (CARDIA)    Difficulty of Paying Living Expenses: Patient declined  Food Insecurity: Patient Declined (01/10/2024)   Received from [facility]   Hunger Vital Sign    Worried About Running Out of Food in the Last Year: Patient  declined    Ran Out of Food in the Last Year: Patient declined  Transportation Needs: Patient Declined (01/10/2024)   Received from [facility]   PRAPARE - Transportation    In the past 12 months, has lack of transportation kept you from medical appointments or from getting medications?: Patient declined    Lack of Transportation (Non-Medical): Patient declined  Physical Activity: Not on file  Stress: Not on file  Social Connections: Not on file  Intimate Partner Violence: Not on file    PSH:  Past Surgical History:  Procedure Laterality Date   FOOT CAPSULE RELEASE W/ PERCUTANEOUS HEEL CORD LENGTHENING, TIBIAL TENDON TRANSFER     ORIF FINGER / THUMB FRACTURE      Physical  Exam:  GEN-  NAD,  supine in bed NEURO- CN 2-12 grossly intact and symmetric. EARS-  external ears clear, no masses or lesions NOSE- clear anteriorly OC/OP-  Tonsillar edema and erythema and exudate.  Right greater than left.  Uvula midline.  Crowding of posterior oropharynx but posterior oropharynx visible this morning NECK-  reactive LAD, no masses or lesions, trachea midline RESP- unlabored CARD-  RRR EXT-  normal turgor  CT-   Approximately 1.3 x 1.7 cm peripherally enhancing fluid collection within the right tonsil and complex approximately 1.2 x 1 cm peripherally enhancing fluid collection in the left tonsil, compatible with abscesses. Surrounding edema including small volume  of prevertebral edema.   A/P: Bilateral tonsillitis with bilateral tonsillar abscesses  Plan:  Significant improvement over 6 hours with Unasyn and Decadron.   Given small nature, improvement, and location of abscesses recommend observation at this time and anticipate significant improvement overnight with continued antibiotics and steroids.  Will place on full liquid diet.  Pain medication ordered.  Will see later on today.   Danny Cunningham 05/19/2024 8:20 AM

## 2024-05-19 NOTE — Assessment & Plan Note (Signed)
 Bilateral tonsillar abscesses. ENT has been consulted by ED. The patient has been given Dexamethasone and started on unasyn. He has hycet for pain control. The patient states that he is feeling better.

## 2024-05-19 NOTE — H&P (Signed)
 History and Physical    Patient: Danny Cunningham KVQ:[id number] DOB: [birth year] DOA: 05/19/2024 DOS: the patient was seen and examined on 05/19/2024 PCP: Lyle San, MD  Patient coming from: Home  Chief Complaint:  Chief Complaint  Patient presents with   Sore Throat   HPI: Danny Cunningham is a 33 y.o. male with medical history significant of anxiety, ADHD, depression. He states that on Saturday 05/13/2024 he developed throat pain, cough, and congestion. The following day he developed fever, chills, and nausea and vomiting. He states that the next three days he was feeling progessively better. However, Thursday he again had worsening of his throat pain until late Thursday night he awoke feeling like he could not breath and noticing that his neck was swollen. He came to the ED. CT of soft tissues of the neck demonstrates bilateral tonsillitis with bilateral abscesses. Blood cultures x 2 were obtained. ENT was consulted from the ED. The patient is receiving Unasyn and dexamethasone. He is getting Hycet for pain control.  The patient states that he is feeling better. Review of Systems: As mentioned in the history of present illness. All other systems reviewed and are negative. Past Medical History:  Diagnosis Date   Knee pain    Past Surgical History:  Procedure Laterality Date   FOOT CAPSULE RELEASE W/ PERCUTANEOUS HEEL CORD LENGTHENING, TIBIAL TENDON TRANSFER     ORIF FINGER / THUMB FRACTURE     Social History:  reports that he has quit smoking. He does not have any smokeless tobacco history on file. He reports current alcohol use. No history on file for drug use.  No Known Allergies  History reviewed. No pertinent family history.  Prior to Admission medications   Medication Sig Start Date End Date Taking? Authorizing Provider  citalopram  (CELEXA ) 20 MG tablet Take 1 tablet (20 mg total) by mouth daily. 05/04/24  Yes   lisdexamfetamine (VYVANSE ) 50 MG capsule Take 1 capsule (50 mg  total) by mouth daily. 03/24/24  Yes   sertraline  (ZOLOFT ) 50 MG tablet Take 1 tablet (50 mg total) by mouth daily with breakfast. 03/24/24  Yes   amoxicillin-clavulanate (AUGMENTIN) 875-125 MG tablet Take 1 tablet (875 mg total) by mouth every 12 (twelve) hours for 10 days 05/18/24     benzonatate  (TESSALON ) 100 MG capsule Take 1-2 capsules (100-200 mg total) by mouth 3 (three) times daily for cough 03/15/24     cefUROXime  (CEFTIN ) 500 MG tablet Take 1 tablet (500 mg total) by mouth 2 (two) times daily. Patient not taking: Reported on 05/19/2024 04/04/16   Sharren Decree, MD  cephALEXin  (KEFLEX ) 500 MG capsule Take 1 capsule (500 mg total) by mouth 3 (three) times daily. Patient not taking: Reported on 05/19/2024 08/25/18   Delsie Figures, PA-C  hydrOXYzine  (ATARAX ) 10 MG tablet Take 1 tablet (10 mg total) by mouth 2 (two) times daily. 05/04/24     ipratropium (ATROVENT ) 0.06 % nasal spray Place 2 sprays into each nostril 3 (three) times daily. 03/15/24     lisdexamfetamine (VYVANSE ) 40 MG capsule Take 1 capsule (40 mg total) by mouth every morning. 10/06/23     lisdexamfetamine (VYVANSE ) 40 MG capsule Take 1 capsule (40 mg total) by mouth every morning. 01/18/24     lisdexamfetamine (VYVANSE ) 40 MG capsule Take 1 capsule (40 mg total) by mouth in the morning. 02/01/24     lisdexamfetamine (VYVANSE ) 50 MG capsule Take 1 capsule (50 mg total) by mouth daily. 04/10/24  lisdexamfetamine (VYVANSE ) 50 MG capsule Take 1 capsule (50 mg total) by mouth daily. 05/04/24     mupirocin  ointment (BACTROBAN ) 2 % Apply 1 application topically 3 (three) times daily. Patient not taking: Reported on 05/19/2024 04/04/16   Sharren Decree, MD  traMADol  (ULTRAM ) 50 MG tablet Take 1 tablet (50 mg total) by mouth every 6 (six) hours as needed. Patient not taking: Reported on 05/19/2024 08/25/18   Delsie Figures, PA-C  venlafaxine  XR (EFFEXOR  XR) 37.5 MG 24 hr capsule Take 1 capsule (37.5 mg total) by mouth daily. 02/17/24       Physical  Exam: Vitals:   05/19/24 0145 05/19/24 0213 05/19/24 0627 05/19/24 0852  BP: (!) 85/58 98/74  108/72  Pulse:  81  77  Resp:    19  Temp:   97.6 F (36.4 C) 98.4 F (36.9 C)  TempSrc:   Temporal Oral  SpO2:  98%  99%  Weight:      Height:       Exam:  Constitutional:  The patient is awake, alert, and oriented x 3. No acute distress. Mouth: Oropharynx is not visible as view is occluded by very large tonsils.  Neck:  neck appears normal, no masses, normal ROM, supple no thyromegaly No LAD Tender to touch anteriorly Respiratory:  No increased work of breathing. No wheezes, rales, or rhonchi No tactile fremitus Cardiovascular:  Regular rate and rhythm No murmurs, ectopy, or gallups. No lateral PMI. No thrills. Abdomen:  Abdomen is soft, non-tender, non-distended No hernias, masses, or organomegaly Normoactive bowel sounds.  Musculoskeletal:  No cyanosis, clubbing, or edema Skin:  No rashes, lesions, ulcers palpation of skin: no induration or nodules Neurologic:  CN 2-12 intact Sensation all 4 extremities intact Psychiatric:  Mental status Mood, affect appropriate Orientation to person, place, time  judgment and insight appear intact  Data Reviewed:  CBC BMP CT soft tissue of neck  Assessment and Plan: Sepsis (HCC) Upon admission patient met sepsis criteria with leukocytosis, tachycardia, hypotension, with a known source of infection (tonsillitis). It is now resolved. Blood cultures x 2 obtained.   Tonsillar abscess Bilateral tonsillar abscesses. ENT has been consulted by ED. The patient has been given Dexamethasone and started on unasyn. He has hycet for pain control. The patient states that he is feeling better.  Tonsillitis The patient developed throat pain on 5/331/2025. He has fever, chills, nausea, and vomiting on 05/14/2024. He then felt better until the following Thursday when he again had fever, chills, and worsening throat pain. He awoke late that  night feeling that he couldn't breath due to the size of his tonsils and noticing that his neck was swollen.      Advance Care Planning:   Code Status: Full Code   Consults: ENT  Family Communication: None available  Severity of Illness: The appropriate patient status for this patient is INPATIENT. Inpatient status is judged to be reasonable and necessary in order to provide the required intensity of service to ensure the patient's safety. The patient's presenting symptoms, physical exam findings, and initial radiographic and laboratory data in the context of their chronic comorbidities is felt to place them at high risk for further clinical deterioration. Furthermore, it is not anticipated that the patient will be medically stable for discharge from the hospital within 2 midnights of admission.   * I certify that at the point of admission it is my clinical judgment that the patient will require inpatient hospital care spanning beyond 2 midnights from  the point of admission due to high intensity of service, high risk for further deterioration and high frequency of surveillance required.*  Author: Lysha Schrade, DO 05/19/2024 12:49 PM  For on call review [email or web address].

## 2024-05-20 LAB — BASIC METABOLIC PANEL WITH GFR
Anion gap: 7 (ref 5–15)
BUN: 11 mg/dL (ref 6–20)
CO2: 26 mmol/L (ref 22–32)
Calcium: 8.7 mg/dL — ABNORMAL LOW (ref 8.9–10.3)
Chloride: 104 mmol/L (ref 98–111)
Creatinine, Ser: 0.74 mg/dL (ref 0.61–1.24)
GFR, Estimated: >60 mL/min (ref >60)
Glucose, Bld: 182 mg/dL — ABNORMAL HIGH (ref 70–99)
Potassium: 3.9 mmol/L (ref 3.5–5.1)
Sodium: 137 mmol/L (ref 135–145)

## 2024-05-20 LAB — CBC
HCT: 36.9 % — ABNORMAL LOW (ref 39.0–52.0)
Hemoglobin: 12.3 g/dL — ABNORMAL LOW (ref 13.0–17.0)
MCH: 28.5 pg (ref 26.0–34.0)
MCHC: 33.3 g/dL (ref 30.0–36.0)
MCV: 85.6 fL (ref 80.0–100.0)
Platelets: 312 10*3/uL (ref 150–400)
RBC: 4.31 MIL/uL (ref 4.22–5.81)
RDW: 12.9 % (ref 11.5–15.5)
WBC: 22 10*3/uL — ABNORMAL HIGH (ref 4.0–10.5)
nRBC: 0 % (ref 0.0–0.2)

## 2024-05-20 LAB — HIV ANTIBODY (ROUTINE TESTING W REFLEX): HIV Screen 4th Generation wRfx: NONREACTIVE

## 2024-05-20 MED ORDER — METHYLPREDNISOLONE 4 MG PO TBPK: ORAL_TABLET | ORAL | 0 refills | Status: DC

## 2024-05-20 MED ORDER — METHYLPREDNISOLONE 4 MG PO TBPK: ORAL_TABLET | ORAL | 0 refills | Status: AC

## 2024-05-20 MED ORDER — OMEPRAZOLE 20 MG PO CPDR: 20.0000 mg | DELAYED_RELEASE_CAPSULE | ORAL | 0 refills | Status: AC | PRN

## 2024-05-20 MED ORDER — POLYETHYLENE GLYCOL 3350 17 G PO PACK: 17.0000 g | PACK | Freq: Every day | ORAL | 0 refills | Status: AC | PRN

## 2024-05-20 MED ORDER — ACETAMINOPHEN 325 MG PO TABS: 650.0000 mg | ORAL_TABLET | Freq: Four times a day (QID) | ORAL | 0 refills | Status: AC | PRN

## 2024-05-20 MED ORDER — HYDROCODONE-ACETAMINOPHEN 5-325 MG PO TABS: 1.0000 | ORAL_TABLET | Freq: Four times a day (QID) | ORAL | 0 refills | Status: AC | PRN

## 2024-05-20 NOTE — Discharge Summary (Signed)
 Physician Discharge Summary   Patient: Danny Cunningham MRN: [id number] DOB: 13-Jan-1991  Admit date:     05/19/2024  Discharge date: 05/20/24  Discharge Physician: Ezzard Holms   PCP: Lyle San, MD   Recommendations at discharge:  Follow-up with ENT  Discharge Diagnoses: Sepsis [facility]) Tonsillar abscess Tonsillitis  Hospital Course: Danny Cunningham is a 33 y.o. male with medical history significant of anxiety, ADHD, depression. He states that on Saturday 05/13/2024 he developed throat pain, cough, and congestion. The following day he developed fever, chills, and nausea and vomiting. He states that the next three days he was feeling progessively better. However, Thursday he again had worsening of his throat pain until late Thursday night he awoke feeling like he could not breath and noticing that his neck was swollen. He came to the ED. CT of soft tissues of the neck demonstrates bilateral tonsillitis with bilateral abscesses. Blood cultures x 2 were obtained. ENT was consulted from the ED. patient has been cleared by ENT for discharge today to complete outpatient follow-up and medications     Consultants: ENT Procedures performed: None Disposition: Home Diet recommendation:  Cardiac diet DISCHARGE MEDICATION: Allergies as of 05/20/2024       Reactions   Prednisone Anxiety        Medication List     STOP taking these medications    amoxicillin  500 MG tablet Commonly known as: AMOXIL    cefUROXime  500 MG tablet Commonly known as: Ceftin    cephALEXin  500 MG capsule Commonly known as: KEFLEX    doxycycline 100 MG capsule Commonly known as: MONODOX   mupirocin  ointment 2 % Commonly known as: BACTROBAN    predniSONE 20 MG tablet Commonly known as: DELTASONE   traMADol  50 MG tablet Commonly known as: ULTRAM        TAKE these medications    acetaminophen  325 MG tablet Commonly known as: TYLENOL  Take 2 tablets (650 mg total) by mouth every 6 (six) hours as needed  for mild pain (pain score 1-3) or fever (or Fever >/= 101).   amoxicillin -clavulanate 875-125 MG tablet Commonly known as: AUGMENTIN  Take 1 tablet (875 mg total) by mouth every 12 (twelve) hours for 10 days   amphetamine-dextroamphetamine 10 MG tablet Commonly known as: ADDERALL Take 10 mg by mouth every evening.   benzonatate  100 MG capsule Commonly known as: TESSALON  Take 1-2 capsules (100-200 mg total) by mouth 3 (three) times daily for cough   citalopram  20 MG tablet Commonly known as: CeleXA  Take 1 tablet (20 mg total) by mouth daily.   HYDROcodone -acetaminophen  5-325 MG tablet Commonly known as: NORCO/VICODIN Take 1-2 tablets by mouth every 6 (six) hours as needed for up to 3 days for moderate pain (pain score 4-6).   hydrOXYzine  10 MG tablet Commonly known as: ATARAX  Take 1 tablet (10 mg total) by mouth 2 (two) times daily.   ibuprofen 200 MG tablet Commonly known as: ADVIL Take 200 mg by mouth.   ipratropium 0.06 % nasal spray Commonly known as: ATROVENT  Place 2 sprays into each nostril 3 (three) times daily.   lisdexamfetamine 50 MG capsule Commonly known as: VYVANSE  Take 1 capsule (50 mg total) by mouth daily. What changed: Another medication with the same name was removed. Continue taking this medication, and follow the directions you see here.   methylPREDNISolone 4 MG Tbpk tablet Commonly known as: MEDROL DOSEPAK Take 1 tablet (4 mg total) by mouth 2 (two) times daily for 3 days, THEN 1 tablet (4 mg total) in  the morning for 3 days. Start taking on: May 20, 2024   omeprazole 20 MG capsule Commonly known as: PRILOSEC Take 1 capsule (20 mg total) by mouth as needed.   polyethylene glycol 17 g packet Commonly known as: MIRALAX  / GLYCOLAX  Take 17 g by mouth daily as needed for mild constipation.   sertraline  50 MG tablet Commonly known as: Zoloft  Take 1 tablet (50 mg total) by mouth daily with breakfast.   venlafaxine  XR 37.5 MG 24 hr capsule Commonly  known as: Effexor  XR Take 1 capsule (37.5 mg total) by mouth daily.        Follow-up Information     Vaught, Creighton, MD Follow up in 4 day(s).   Specialty: Otolaryngology Why: FOLLOW UP ON May 24 2024 Contact information: [address] [place] [address] [phone]                Discharge Exam: Cleavon Curls Weights   05/19/24 0136  Weight: 77.1 kg    Mouth: Oropharynx is not visible as view is occluded by very large tonsils.  Neck:  neck appears normal, no masses, normal ROM, supple no thyromegaly No LAD Tender to touch anteriorly Respiratory:  No increased work of breathing. No wheezes, rales, or rhonchi No tactile fremitus Cardiovascular:  Regular rate and rhythm No murmurs, ectopy, or gallups. No lateral PMI. No thrills. Abdomen:  Abdomen is soft, non-tender, non-distended No hernias, masses, or organomegaly Normoactive bowel sounds.  Musculoskeletal:  No cyanosis, clubbing, or edema Skin:  No rashes, lesions, ulcers palpation of skin: no induration or nodules Neurologic:  CN 2-12 intact Sensation all 4 extremities intact  Condition at discharge: good  The results of significant diagnostics from this hospitalization (including imaging, microbiology, ancillary and laboratory) are listed below for reference.   Imaging Studies: CT Soft Tissue Neck W Contrast Result Date: 05/19/2024 CLINICAL DATA:  Epiglottitis or tonsillitis suspected eval PTA EXAM: CT NECK WITH CONTRAST TECHNIQUE: Multidetector CT imaging of the neck was performed using the standard protocol following the bolus administration of intravenous contrast. RADIATION DOSE REDUCTION: This exam was performed according to the departmental dose-optimization program which includes automated exposure control, adjustment of the mA and/or kV according to patient size and/or use of iterative reconstruction technique. CONTRAST:  75mL OMNIPAQUE  IOHEXOL  300 MG/ML  SOLN  COMPARISON:  None Available. FINDINGS: Pharynx and larynx: Edematous enlarged tonsils bilaterally, compatible tonsillitis. Approximately 1.3 x 1.7 cm peripherally enhancing fluid collection within the right tonsil and complex approximately 1.2 x 1 cm peripherally enhancing fluid collection in the left tonsil, compatible with abscesses. Surrounding edema including small volume of prevertebral edema. Normal epiglottis and larynx. Salivary glands: No inflammation, mass, or stone. Thyroid: Normal. Lymph nodes: Enlarged upper cervical chain lymph nodes bilaterally. Vascular: No obvious abnormality on limited assessment due to non arterial timing. Limited intracranial: Negative. Visualized orbits: Not imaged. Mastoids and visualized paranasal sinuses: Clear. Skeleton: No acute abnormality. Upper chest: Negative. IMPRESSION: 1. Findings compatible with tonsillitis and bilateral tonsillar abscesses, described above. Surrounding edema including small volume of prevertebral edema. 2. Enlarged upper cervical chain lymph nodes bilaterally, likely reactive. Electronically Signed   By: Stevenson Elbe M.D.   On: 05/19/2024 02:56    Microbiology: No results found for this or any previous visit.  Labs: CBC: Recent Labs  Lab 05/19/24 0147 05/20/24 0554  WBC 20.6* 22.0*  NEUTROABS 15.7*  --   HGB 12.7* 12.3*  HCT 38.2* 36.9*  MCV 87.0 85.6  PLT 330 312  Basic Metabolic Panel: Recent Labs  Lab 05/19/24 0147 05/20/24 0554  NA 136 137  K 3.5 3.9  CL 101 104  CO2 26 26  GLUCOSE 135* 182*  BUN 14 11  CREATININE 0.98 0.74  CALCIUM 8.6* 8.7*   Liver Function Tests: No results for input(s): "AST", "ALT", "ALKPHOS", "BILITOT", "PROT", "ALBUMIN" in the last 168 hours. CBG: No results for input(s): "GLUCAP" in the last 168 hours.  Discharge time spent:  35 minutes.  Signed: Ezzard Holms, MD [facility] 05/20/2024

## 2024-05-20 NOTE — Progress Notes (Signed)
..  05/20/2024 8:53 AM  Mcnab, Kesler [id number]    Temp:  [97.7 F (36.5 C)-98.1 F (36.7 C)] 97.7 F (36.5 C) (06/07 0744) Pulse Rate:  [75-88] 82 (06/07 0744) Resp:  [16-18] 17 (06/07 0744) BP: (110-131)/(63-82) 131/82 (06/07 0744) SpO2:  [97 %-100 %] 100 % (06/07 0744),     Intake/Output Summary (Last 24 hours) at 05/20/2024 0853 Last data filed at 05/20/2024 0338 Gross per 24 hour  Intake 301 ml  Output --  Net 301 ml    Results for orders placed or performed during the hospital encounter of 05/19/24 (from the past 24 hours)  Basic metabolic panel     Status: Abnormal   Collection Time: 05/20/24  5:54 AM  Result Value Ref Range   Sodium 137 135 - 145 mmol/L   Potassium 3.9 3.5 - 5.1 mmol/L   Chloride 104 98 - 111 mmol/L   CO2 26 22 - 32 mmol/L   Glucose, Bld 182 (H) 70 - 99 mg/dL   BUN 11 6 - 20 mg/dL   Creatinine, Ser 0.86 0.61 - 1.24 mg/dL   Calcium 8.7 (L) 8.9 - 10.3 mg/dL   GFR, Estimated >57 >84 mL/min   Anion gap 7 5 - 15  CBC     Status: Abnormal   Collection Time: 05/20/24  5:54 AM  Result Value Ref Range   WBC 22.0 (H) 4.0 - 10.5 K/uL   RBC 4.31 4.22 - 5.81 MIL/uL   Hemoglobin 12.3 (L) 13.0 - 17.0 g/dL   HCT 69.6 (L) 29.5 - 28.4 %   MCV 85.6 80.0 - 100.0 fL   MCH 28.5 26.0 - 34.0 pg   MCHC 33.3 30.0 - 36.0 g/dL   RDW 13.2 44.0 - 10.2 %   Platelets 312 150 - 400 K/uL   nRBC 0.0 0.0 - 0.2 %    SUBJECTIVE:  Feeling much better today.  Slept well.  Pain controlled with Hycet.  Breathing well.  Wishing to have regular diet  OBJECTIVE:  GEN-  NAD OC/OP-  edematous tonsils but improved overnight.  Visible posterior oropharynx and uvula midline.  Tonsil hypertrophy R > L  IMPRESSION:  Tonsillitis with intra-tonsillar small abscesses  PLAN:  Responding well to medication.  Discharge home on Augmentin  875, Medrol 4mg  dose pack over 6 days, and several days of Hycet.  Recommend follow up on Wed. At [facility].  Lora Glomski 05/20/2024, 8:53 AM

## 2024-05-20 NOTE — Plan of Care (Signed)

## 2024-05-20 NOTE — Progress Notes (Signed)
 Patient discharging home, IV removed and all discharge education and instructions provided. All belongings sent with patient.

## 2024-05-22 ENCOUNTER — Other Ambulatory Visit: Payer: Self-pay

## 2024-05-22 ENCOUNTER — Other Ambulatory Visit (HOSPITAL_COMMUNITY): Payer: Self-pay

## 2024-05-23 ENCOUNTER — Other Ambulatory Visit (HOSPITAL_COMMUNITY): Payer: Self-pay

## 2024-05-23 ENCOUNTER — Other Ambulatory Visit: Payer: Self-pay

## 2024-05-23 MED ORDER — AMOXICILLIN-POT CLAVULANATE 875-125 MG PO TABS
1.0000 | ORAL_TABLET | Freq: Two times a day (BID) | ORAL | 0 refills | Status: AC
  Filled 2024-05-23: qty 20, 10d supply, fill #0

## 2024-05-23 MED ORDER — OXYCODONE HCL 5 MG PO TABS
5.0000 mg | ORAL_TABLET | ORAL | 0 refills | Status: DC | PRN
  Filled 2024-05-23: qty 15, 3d supply, fill #0

## 2024-06-02 ENCOUNTER — Other Ambulatory Visit: Payer: Self-pay

## 2024-06-02 MED ORDER — METHYLPREDNISOLONE 4 MG PO TBPK
ORAL_TABLET | ORAL | 1 refills | Status: AC
  Filled 2024-06-02: qty 21, 6d supply, fill #0

## 2024-06-02 MED ORDER — DOXYCYCLINE MONOHYDRATE 100 MG PO CAPS
100.0000 mg | ORAL_CAPSULE | Freq: Two times a day (BID) | ORAL | 0 refills | Status: AC
  Filled 2024-06-02: qty 28, 14d supply, fill #0

## 2024-06-02 MED ORDER — OXYCODONE HCL 5 MG PO TABS
5.0000 mg | ORAL_TABLET | ORAL | 0 refills | Status: AC | PRN
  Filled 2024-06-02: qty 15, 3d supply, fill #0

## 2024-06-19 ENCOUNTER — Other Ambulatory Visit: Payer: Self-pay

## 2024-06-19 MED ORDER — CITALOPRAM HYDROBROMIDE 20 MG PO TABS
20.0000 mg | ORAL_TABLET | Freq: Every day | ORAL | 0 refills | Status: AC
  Filled 2024-06-19: qty 90, 90d supply, fill #0

## 2024-06-19 MED ORDER — LISDEXAMFETAMINE DIMESYLATE 50 MG PO CAPS: 50.0000 mg | ORAL_CAPSULE | Freq: Every day | ORAL | 0 refills | Status: AC

## 2024-06-19 MED ORDER — HYDROXYZINE HCL 10 MG PO TABS
10.0000 mg | ORAL_TABLET | Freq: Two times a day (BID) | ORAL | 0 refills | Status: AC
  Filled 2024-06-19: qty 180, 90d supply, fill #0

## 2024-06-23 ENCOUNTER — Other Ambulatory Visit: Payer: Self-pay

## 2024-06-23 MED ORDER — DOXYCYCLINE HYCLATE 100 MG PO TABS
100.0000 mg | ORAL_TABLET | Freq: Two times a day (BID) | ORAL | 0 refills | Status: AC
  Filled 2024-06-23: qty 42, 21d supply, fill #0

## 2024-06-23 MED ORDER — METHYLPREDNISOLONE 4 MG PO TBPK
ORAL_TABLET | ORAL | 0 refills | Status: AC
  Filled 2024-06-23: qty 21, 6d supply, fill #0

## 2024-09-25 ENCOUNTER — Other Ambulatory Visit: Payer: Self-pay

## 2024-09-25 MED ORDER — VENLAFAXINE HCL ER 37.5 MG PO CP24
37.5000 mg | ORAL_CAPSULE | Freq: Every day | ORAL | 0 refills | Status: AC
  Filled 2024-09-25: qty 30, 30d supply, fill #0

## 2024-09-25 MED ORDER — HYDROXYZINE HCL 10 MG PO TABS
10.0000 mg | ORAL_TABLET | Freq: Two times a day (BID) | ORAL | 0 refills | Status: AC
  Filled 2024-09-25: qty 180, 90d supply, fill #0

## 2024-09-25 MED ORDER — CITALOPRAM HYDROBROMIDE 20 MG PO TABS
20.0000 mg | ORAL_TABLET | Freq: Every day | ORAL | 0 refills | Status: AC
  Filled 2024-09-25: qty 90, 90d supply, fill #0

## 2024-09-25 MED ORDER — LISDEXAMFETAMINE DIMESYLATE 50 MG PO CAPS
50.0000 mg | ORAL_CAPSULE | Freq: Every day | ORAL | 0 refills | Status: AC
  Filled 2024-09-25: qty 90, 90d supply, fill #0

## 2024-10-05 ENCOUNTER — Other Ambulatory Visit: Payer: Self-pay

## 2024-10-05 MED ORDER — VENLAFAXINE HCL ER 37.5 MG PO CP24
37.5000 mg | ORAL_CAPSULE | Freq: Every day | ORAL | 0 refills | Status: AC
  Filled 2024-10-05: qty 90, 90d supply, fill #0

## 2024-10-10 ENCOUNTER — Other Ambulatory Visit: Payer: Self-pay

## 2024-10-10 MED ORDER — CITALOPRAM HYDROBROMIDE 20 MG PO TABS
20.0000 mg | ORAL_TABLET | Freq: Every day | ORAL | 0 refills | Status: AC
  Filled 2024-10-10 (×2): qty 30, 30d supply, fill #0

## 2024-10-10 MED ORDER — VENLAFAXINE HCL ER 37.5 MG PO CP24
37.5000 mg | ORAL_CAPSULE | Freq: Every day | ORAL | 0 refills | Status: AC
  Filled 2024-10-10 (×2): qty 30, 30d supply, fill #0

## 2024-10-10 MED ORDER — LISDEXAMFETAMINE DIMESYLATE 50 MG PO CAPS
50.0000 mg | ORAL_CAPSULE | Freq: Every day | ORAL | 0 refills | Status: AC
  Filled 2024-10-10 – 2024-12-30 (×2): qty 30, 30d supply, fill #0

## 2024-10-20 ENCOUNTER — Other Ambulatory Visit: Payer: Self-pay

## 2024-10-25 ENCOUNTER — Other Ambulatory Visit: Payer: Self-pay

## 2024-10-25 MED ORDER — CITALOPRAM HYDROBROMIDE 20 MG PO TABS
30.0000 mg | ORAL_TABLET | Freq: Every day | ORAL | 2 refills | Status: DC
  Filled 2024-10-25 (×2): qty 45, 30d supply, fill #0

## 2024-10-25 MED ORDER — LISDEXAMFETAMINE DIMESYLATE 50 MG PO CAPS
50.0000 mg | ORAL_CAPSULE | Freq: Every day | ORAL | 0 refills | Status: AC
  Filled 2024-10-25: qty 30, 30d supply, fill #0

## 2024-10-26 ENCOUNTER — Other Ambulatory Visit: Payer: Self-pay

## 2024-11-27 ENCOUNTER — Other Ambulatory Visit: Payer: Self-pay

## 2024-11-27 MED ORDER — LISDEXAMFETAMINE DIMESYLATE 50 MG PO CAPS
50.0000 mg | ORAL_CAPSULE | Freq: Every morning | ORAL | 0 refills | Status: AC
  Filled 2024-11-27 – 2024-12-02 (×2): qty 30, 30d supply, fill #0

## 2024-11-27 MED ORDER — VILAZODONE HCL 10 MG PO TABS
10.0000 mg | ORAL_TABLET | Freq: Every day | ORAL | 2 refills | Status: AC
  Filled 2024-11-27 (×2): qty 30, 30d supply, fill #0
  Filled 2024-12-30: qty 30, 30d supply, fill #1

## 2024-11-28 ENCOUNTER — Other Ambulatory Visit: Payer: Self-pay

## 2024-12-02 ENCOUNTER — Other Ambulatory Visit: Payer: Self-pay

## 2024-12-25 ENCOUNTER — Other Ambulatory Visit: Payer: Self-pay

## 2024-12-25 MED ORDER — LISDEXAMFETAMINE DIMESYLATE 50 MG PO CAPS
50.0000 mg | ORAL_CAPSULE | Freq: Every morning | ORAL | 0 refills | Status: AC
  Filled 2025-01-12: qty 6, 6d supply, fill #0
  Filled 2025-01-16: qty 24, 24d supply, fill #1

## 2024-12-30 ENCOUNTER — Other Ambulatory Visit: Payer: Self-pay

## 2025-01-11 ENCOUNTER — Other Ambulatory Visit: Payer: Self-pay

## 2025-01-11 MED ORDER — LISDEXAMFETAMINE DIMESYLATE 50 MG PO CAPS
50.0000 mg | ORAL_CAPSULE | Freq: Every morning | ORAL | 0 refills | Status: AC
  Filled 2025-01-11 – 2025-01-12 (×2): qty 30, 30d supply, fill #0

## 2025-01-11 MED ORDER — VILAZODONE HCL 20 MG PO TABS
20.0000 mg | ORAL_TABLET | Freq: Every day | ORAL | 2 refills | Status: AC
  Filled 2025-01-11: qty 30, 30d supply, fill #0

## 2025-01-12 ENCOUNTER — Other Ambulatory Visit: Payer: Self-pay

## 2025-01-16 ENCOUNTER — Other Ambulatory Visit: Payer: Self-pay
